# Patient Record
Sex: Male | Born: 1945 | Race: Black or African American | Hispanic: No | Marital: Married
Health system: Southern US, Community
[De-identification: ages and names within clinical notes are randomized; demographics above are authoritative.]

---

## 2016-04-01 ENCOUNTER — Emergency Department (HOSPITAL_COMMUNITY): Payer: No Typology Code available for payment source

## 2016-04-01 ENCOUNTER — Emergency Department (HOSPITAL_COMMUNITY)
Admission: EM | Admit: 2016-04-01 | Discharge: 2016-04-01 | Disposition: A | Discharge status: Home / Self Care | Payer: No Typology Code available for payment source | Attending: Emergency Medicine | Admitting: Emergency Medicine

## 2016-04-01 ENCOUNTER — Encounter (HOSPITAL_COMMUNITY): Payer: Self-pay | Admitting: Emergency Medicine

## 2016-04-01 DIAGNOSIS — S22000A Wedge compression fracture of unspecified thoracic vertebra, initial encounter for closed fracture: Secondary | ICD-10-CM

## 2016-04-01 DIAGNOSIS — Y939 Activity, unspecified: Secondary | ICD-10-CM | POA: Diagnosis not present

## 2016-04-01 DIAGNOSIS — Z23 Encounter for immunization: Secondary | ICD-10-CM | POA: Diagnosis not present

## 2016-04-01 DIAGNOSIS — S299XXA Unspecified injury of thorax, initial encounter: Secondary | ICD-10-CM | POA: Diagnosis present

## 2016-04-01 DIAGNOSIS — Y999 Unspecified external cause status: Secondary | ICD-10-CM | POA: Diagnosis not present

## 2016-04-01 DIAGNOSIS — Y92410 Unspecified street and highway as the place of occurrence of the external cause: Secondary | ICD-10-CM | POA: Diagnosis not present

## 2016-04-01 DIAGNOSIS — S22008A Other fracture of unspecified thoracic vertebra, initial encounter for closed fracture: Secondary | ICD-10-CM | POA: Diagnosis not present

## 2016-04-01 DIAGNOSIS — S0031XA Abrasion of nose, initial encounter: Secondary | ICD-10-CM | POA: Insufficient documentation

## 2016-04-01 DIAGNOSIS — R51 Headache: Secondary | ICD-10-CM | POA: Diagnosis not present

## 2016-04-01 DIAGNOSIS — R109 Unspecified abdominal pain: Secondary | ICD-10-CM | POA: Insufficient documentation

## 2016-04-01 DIAGNOSIS — S2249XA Multiple fractures of ribs, unspecified side, initial encounter for closed fracture: Secondary | ICD-10-CM

## 2016-04-01 DIAGNOSIS — S2241XA Multiple fractures of ribs, right side, initial encounter for closed fracture: Secondary | ICD-10-CM | POA: Diagnosis not present

## 2016-04-01 LAB — CBC WITH DIFFERENTIAL/PLATELET
BASOS PCT: 0 %
Basophils Absolute: 0 10*3/uL (ref 0.0–0.1)
EOS ABS: 0.2 10*3/uL (ref 0.0–0.7)
Eosinophils Relative: 2 %
HCT: 42.2 % (ref 39.0–52.0)
Hemoglobin: 14.6 g/dL (ref 13.0–17.0)
LYMPHS ABS: 2.4 10*3/uL (ref 0.7–4.0)
Lymphocytes Relative: 22 %
MCH: 34 pg (ref 26.0–34.0)
MCHC: 34.6 g/dL (ref 30.0–36.0)
MCV: 98.1 fL (ref 78.0–100.0)
MONO ABS: 0.4 10*3/uL (ref 0.1–1.0)
MONOS PCT: 4 %
Neutro Abs: 7.6 10*3/uL (ref 1.7–7.7)
Neutrophils Relative %: 72 %
Platelets: 196 10*3/uL (ref 150–400)
RBC: 4.3 MIL/uL (ref 4.22–5.81)
RDW: 12.4 % (ref 11.5–15.5)
WBC: 10.6 10*3/uL — ABNORMAL HIGH (ref 4.0–10.5)

## 2016-04-01 LAB — I-STAT CHEM 8, ED
BUN: 15 mg/dL (ref 6–20)
CALCIUM ION: 1.09 mmol/L — AB (ref 1.15–1.40)
CREATININE: 1.1 mg/dL (ref 0.61–1.24)
Chloride: 101 mmol/L (ref 101–111)
GLUCOSE: 174 mg/dL — AB (ref 65–99)
HEMATOCRIT: 43 % (ref 39.0–52.0)
HEMOGLOBIN: 14.6 g/dL (ref 13.0–17.0)
Potassium: 3.5 mmol/L (ref 3.5–5.1)
Sodium: 138 mmol/L (ref 135–145)
TCO2: 25 mmol/L (ref 0–100)

## 2016-04-01 MED ORDER — MORPHINE SULFATE (PF) 4 MG/ML IV SOLN
4.0000 mg | Freq: Once | INTRAVENOUS | Status: AC
  Administered 2016-04-01: 4 mg via INTRAVENOUS
  Filled 2016-04-01: qty 1

## 2016-04-01 MED ORDER — HYDROCODONE-ACETAMINOPHEN 5-325 MG PO TABS: 1.0000 | ORAL_TABLET | Freq: Four times a day (QID) | ORAL | 0 refills | Status: AC | PRN

## 2016-04-01 MED ORDER — NAPROXEN 375 MG PO TABS: 375.0000 mg | ORAL_TABLET | Freq: Two times a day (BID) | ORAL | 0 refills | Status: AC

## 2016-04-01 MED ORDER — SODIUM CHLORIDE 0.9 % IV BOLUS (SEPSIS)
125.0000 mL | Freq: Once | INTRAVENOUS | Status: AC
  Administered 2016-04-01: 125 mL via INTRAVENOUS

## 2016-04-01 MED ORDER — IOPAMIDOL (ISOVUE-300) INJECTION 61%
INTRAVENOUS | Status: AC
  Administered 2016-04-01: 100 mL
  Filled 2016-04-01: qty 100

## 2016-04-01 MED ORDER — TETANUS-DIPHTH-ACELL PERTUSSIS 5-2.5-18.5 LF-MCG/0.5 IM SUSP
0.5000 mL | Freq: Once | INTRAMUSCULAR | Status: AC
  Administered 2016-04-01: 0.5 mL via INTRAMUSCULAR
  Filled 2016-04-01: qty 0.5

## 2016-04-01 NOTE — ED Notes (Signed)
Rembrandt, ortho tech contacted this RN to inform he will page [employer] to apply TLSO brace.

## 2016-04-01 NOTE — ED Notes (Signed)
[employer] applied TLSO brace.
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
## 2016-04-01 NOTE — Progress Notes (Signed)
Orthopedic Tech Progress Note Patient Details:  Charles Sloan 02/11/1946 [id number]  Patient ID: Charles Sloan, male   DOB: 02/09/1946, 70 y.o.   MRN: [id number]   Nikki DomCrawford, Shellie Rogoff 04/01/2016, 2:22 PM Called in [employer] brace order; spoke with Tenny Crawoss
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
## 2016-04-01 NOTE — Discharge Instructions (Signed)
Wear the brace as instructed, continue to use the incentive spirometer to help with your  breathing, follow-up with Dr Yetta BarreJones, return to the emergency room if he started experiencing numbness or weakness
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
